# Patient Record
Sex: Female | Born: 1995 | Race: Black or African American | Hispanic: No | Marital: Single | Smoking: Never smoker
Health system: Southern US, Community
[De-identification: ages and names within clinical notes are randomized; demographics above are authoritative.]

## PROBLEM LIST (undated history)

## (undated) HISTORY — PX: SKIN GRAFT: SHX250

## (undated) HISTORY — PX: KNEE ARTHROSCOPY: SUR90

## (undated) HISTORY — PX: KNEE SURGERY: SHX244

---
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
## 2017-07-26 ENCOUNTER — Ambulatory Visit (HOSPITAL_COMMUNITY)
Admission: EM | Admit: 2017-07-26 | Discharge: 2017-07-26 | Disposition: A | Discharge status: Home / Self Care | Payer: BLUE CROSS/BLUE SHIELD | Attending: Physician Assistant | Admitting: Physician Assistant

## 2017-07-26 ENCOUNTER — Encounter (HOSPITAL_COMMUNITY): Payer: Self-pay | Admitting: Emergency Medicine

## 2017-07-26 DIAGNOSIS — H0015 Chalazion left lower eyelid: Secondary | ICD-10-CM

## 2017-07-26 MED ORDER — DOXYCYCLINE HYCLATE 100 MG PO CAPS: 100.0000 mg | ORAL_CAPSULE | Freq: Two times a day (BID) | ORAL | 0 refills | Status: AC

## 2017-07-26 NOTE — Discharge Instructions (Signed)
If not improving after taking doxycyline the please Google: [facility] and tell them that Deliah BostonMichael Neal Oshea PA-C advised that you go there.

## 2017-07-26 NOTE — ED Provider Notes (Signed)
07/26/2017 8:20 PM   DOB: 1995-11-14 / MRN: [id number]  SUBJECTIVE:  Nicole Harmon is a 21 y.o. female presenting for bump in the left lower eye lid.  No changes in vision.  The lesion is mildly painful.  No fever. She has an IUD in place, which was placed in the last 6 months.   She has No Known Allergies.   She  has no past medical history on file.    She  reports that  has never smoked. she has never used smokeless tobacco. She reports that she does not drink alcohol or use drugs. She  has no sexual activity history on file. The patient  has a past surgical history that includes Knee arthroscopy and Skin graft.  Her family history is not on file.  Review of Systems  Constitutional: Negative for chills and fever.  Eyes: Negative for blurred vision, double vision, photophobia, pain, discharge and redness.  Skin: Negative for itching and rash.  Neurological: Negative for dizziness.    OBJECTIVE:  BP 115/76 (BP Location: Right Arm)   Pulse 91   Temp 98.9 F (37.2 C) (Oral)   Resp 18   SpO2 99%   Physical Exam  Constitutional: She is oriented to person, place, and time. She appears well-developed and well-nourished. No distress.  HENT:  Mouth/Throat: No oropharyngeal exudate.  Eyes: Right conjunctiva is not injected. Right conjunctiva has no hemorrhage. Left conjunctiva is not injected. Left conjunctiva has no hemorrhage. Right eye exhibits normal extraocular motion and no nystagmus. Left eye exhibits normal extraocular motion and no nystagmus. Right pupil is round and reactive. Left pupil is round and reactive. Pupils are equal.    Cardiovascular: Normal rate and regular rhythm.  Pulmonary/Chest: Effort normal and breath sounds normal.  Musculoskeletal: Normal range of motion.  Neurological: She is alert and oriented to person, place, and time.  Skin: She is not diaphoretic.    No results found for this or any previous visit (from the past 72 hour(s)).  No results  found.  ASSESSMENT AND PLAN:  The encounter diagnosis was Chalazion of left lower eyelid. Doxy in not improving with warm compress.     The patient is advised to call or return to clinic if she does not see an improvement in symptoms, or to seek the care of the closest emergency department if she worsens with the above plan.   Deliah BostonMichael Kazuto Sevey, MHS, PA-C 07/26/2017 8:20 PM    Ofilia Neaslark, Reia Viernes L, PA-C 07/26/17 2032

## 2017-07-26 NOTE — ED Triage Notes (Signed)
Pt sts raised area inside left eye lid x 2 days

## 2017-08-05 ENCOUNTER — Encounter (HOSPITAL_COMMUNITY): Payer: Self-pay | Admitting: *Deleted

## 2017-08-05 ENCOUNTER — Emergency Department (HOSPITAL_COMMUNITY)
Admission: EM | Admit: 2017-08-05 | Discharge: 2017-08-05 | Disposition: A | Discharge status: Home / Self Care | Payer: BLUE CROSS/BLUE SHIELD | Attending: Emergency Medicine | Admitting: Emergency Medicine

## 2017-08-05 DIAGNOSIS — R51 Headache: Secondary | ICD-10-CM | POA: Insufficient documentation

## 2017-08-05 DIAGNOSIS — Z5321 Procedure and treatment not carried out due to patient leaving prior to being seen by health care provider: Secondary | ICD-10-CM | POA: Diagnosis not present

## 2017-08-05 NOTE — ED Notes (Signed)
Pt states she is leaving due to wait.  Encouraged her to wait to be seen and explained that she was next to be seen in fast track area.  Pt states she was told that an hour ago and no longer wants to wait to be seen.  Pt left.
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
## 2017-08-06 ENCOUNTER — Encounter (HOSPITAL_BASED_OUTPATIENT_CLINIC_OR_DEPARTMENT_OTHER): Payer: Self-pay | Admitting: *Deleted

## 2017-08-06 ENCOUNTER — Emergency Department (HOSPITAL_BASED_OUTPATIENT_CLINIC_OR_DEPARTMENT_OTHER)
Admission: EM | Admit: 2017-08-06 | Discharge: 2017-08-06 | Disposition: A | Discharge status: Home / Self Care | Payer: BLUE CROSS/BLUE SHIELD | Attending: Emergency Medicine | Admitting: Emergency Medicine

## 2017-08-06 ENCOUNTER — Other Ambulatory Visit: Payer: Self-pay

## 2017-08-06 ENCOUNTER — Emergency Department (HOSPITAL_BASED_OUTPATIENT_CLINIC_OR_DEPARTMENT_OTHER): Payer: BLUE CROSS/BLUE SHIELD

## 2017-08-06 DIAGNOSIS — S2091XA Abrasion of unspecified parts of thorax, initial encounter: Secondary | ICD-10-CM | POA: Diagnosis not present

## 2017-08-06 DIAGNOSIS — Z23 Encounter for immunization: Secondary | ICD-10-CM | POA: Diagnosis not present

## 2017-08-06 DIAGNOSIS — R51 Headache: Secondary | ICD-10-CM | POA: Diagnosis not present

## 2017-08-06 DIAGNOSIS — S0512XA Contusion of eyeball and orbital tissues, left eye, initial encounter: Secondary | ICD-10-CM | POA: Diagnosis not present

## 2017-08-06 DIAGNOSIS — Y998 Other external cause status: Secondary | ICD-10-CM | POA: Diagnosis not present

## 2017-08-06 DIAGNOSIS — R0789 Other chest pain: Secondary | ICD-10-CM | POA: Diagnosis not present

## 2017-08-06 DIAGNOSIS — R0781 Pleurodynia: Secondary | ICD-10-CM

## 2017-08-06 DIAGNOSIS — Y92199 Unspecified place in other specified residential institution as the place of occurrence of the external cause: Secondary | ICD-10-CM | POA: Insufficient documentation

## 2017-08-06 DIAGNOSIS — Y9389 Activity, other specified: Secondary | ICD-10-CM | POA: Insufficient documentation

## 2017-08-06 DIAGNOSIS — T1490XA Injury, unspecified, initial encounter: Secondary | ICD-10-CM

## 2017-08-06 DIAGNOSIS — S0592XA Unspecified injury of left eye and orbit, initial encounter: Secondary | ICD-10-CM | POA: Diagnosis present

## 2017-08-06 MED ORDER — METHOCARBAMOL 500 MG PO TABS: 500.0000 mg | ORAL_TABLET | Freq: Two times a day (BID) | ORAL | 0 refills | Status: AC

## 2017-08-06 MED ORDER — IBUPROFEN 400 MG PO TABS
600.0000 mg | ORAL_TABLET | Freq: Once | ORAL | Status: AC
  Administered 2017-08-06: 600 mg via ORAL
  Filled 2017-08-06: qty 1

## 2017-08-06 MED ORDER — FLUCONAZOLE 150 MG PO TABS
150.0000 mg | ORAL_TABLET | ORAL | 0 refills | Status: DC
Start: 2017-08-08 — End: 2019-10-30

## 2017-08-06 MED ORDER — TETANUS-DIPHTH-ACELL PERTUSSIS 5-2.5-18.5 LF-MCG/0.5 IM SUSP
0.5000 mL | Freq: Once | INTRAMUSCULAR | Status: AC
  Administered 2017-08-06: 0.5 mL via INTRAMUSCULAR
  Filled 2017-08-06: qty 0.5
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
## 2017-08-06 NOTE — ED Notes (Signed)
ED Provider at bedside. 

## 2017-08-06 NOTE — ED Notes (Signed)
Pt in X-Ray ?

## 2017-12-15 ENCOUNTER — Other Ambulatory Visit: Payer: Self-pay | Admitting: Physician Assistant

## 2019-10-30 ENCOUNTER — Encounter (HOSPITAL_COMMUNITY): Payer: Self-pay | Admitting: Emergency Medicine

## 2019-10-30 ENCOUNTER — Other Ambulatory Visit: Payer: Self-pay

## 2019-10-30 ENCOUNTER — Emergency Department (HOSPITAL_COMMUNITY)
Admission: EM | Admit: 2019-10-30 | Discharge: 2019-10-30 | Disposition: A | Discharge status: Home / Self Care | Payer: BC Managed Care – PPO | Attending: Emergency Medicine | Admitting: Emergency Medicine

## 2019-10-30 DIAGNOSIS — R112 Nausea with vomiting, unspecified: Secondary | ICD-10-CM | POA: Insufficient documentation

## 2019-10-30 DIAGNOSIS — F1290 Cannabis use, unspecified, uncomplicated: Secondary | ICD-10-CM | POA: Diagnosis not present

## 2019-10-30 DIAGNOSIS — R42 Dizziness and giddiness: Secondary | ICD-10-CM | POA: Insufficient documentation

## 2019-10-30 LAB — URINALYSIS, ROUTINE W REFLEX MICROSCOPIC
Bilirubin Urine: NEGATIVE
Glucose, UA: NEGATIVE mg/dL
Hgb urine dipstick: NEGATIVE
Ketones, ur: 80 mg/dL — AB
Nitrite: NEGATIVE
Protein, ur: 30 mg/dL — AB
Specific Gravity, Urine: 1.032 — ABNORMAL HIGH (ref 1.005–1.030)
pH: 6 (ref 5.0–8.0)

## 2019-10-30 LAB — COMPREHENSIVE METABOLIC PANEL
ALT: 17 U/L (ref 0–44)
AST: 23 U/L (ref 15–41)
Albumin: 4.3 g/dL (ref 3.5–5.0)
Alkaline Phosphatase: 42 U/L (ref 38–126)
Anion gap: 11 (ref 5–15)
BUN: 11 mg/dL (ref 6–20)
CO2: 23 mmol/L (ref 22–32)
Calcium: 9.6 mg/dL (ref 8.9–10.3)
Chloride: 103 mmol/L (ref 98–111)
Creatinine, Ser: 0.98 mg/dL (ref 0.44–1.00)
GFR calc Af Amer: >60 mL/min (ref >60)
GFR calc non Af Amer: >60 mL/min (ref >60)
Glucose, Bld: 116 mg/dL — ABNORMAL HIGH (ref 70–99)
Potassium: 3.3 mmol/L — ABNORMAL LOW (ref 3.5–5.1)
Sodium: 137 mmol/L (ref 135–145)
Total Bilirubin: 2.1 mg/dL — ABNORMAL HIGH (ref 0.3–1.2)
Total Protein: 7.7 g/dL (ref 6.5–8.1)

## 2019-10-30 LAB — CBC
HCT: 39.9 % (ref 36.0–46.0)
Hemoglobin: 12.6 g/dL (ref 12.0–15.0)
MCH: 27.7 pg (ref 26.0–34.0)
MCHC: 31.6 g/dL (ref 30.0–36.0)
MCV: 87.7 fL (ref 80.0–100.0)
Platelets: 249 10*3/uL (ref 150–400)
RBC: 4.55 MIL/uL (ref 3.87–5.11)
RDW: 13.4 % (ref 11.5–15.5)
WBC: 10.9 10*3/uL — ABNORMAL HIGH (ref 4.0–10.5)
nRBC: 0 % (ref 0.0–0.2)

## 2019-10-30 LAB — I-STAT BETA HCG BLOOD, ED (MC, WL, AP ONLY): I-stat hCG, quantitative: <5 m[IU]/mL (ref <5)

## 2019-10-30 LAB — LIPASE, BLOOD: Lipase: 26 U/L (ref 11–51)

## 2019-10-30 MED ORDER — ONDANSETRON HCL 4 MG/2ML IJ SOLN
4.0000 mg | Freq: Once | INTRAMUSCULAR | Status: AC
  Administered 2019-10-30: 22:00:00 4 mg via INTRAVENOUS
  Filled 2019-10-30: qty 2

## 2019-10-30 MED ORDER — SODIUM CHLORIDE 0.9% FLUSH
3.0000 mL | Freq: Once | INTRAVENOUS | Status: AC
  Administered 2019-10-30: 18:00:00 3 mL via INTRAVENOUS

## 2019-10-30 MED ORDER — HALOPERIDOL LACTATE 5 MG/ML IJ SOLN
1.0000 mg | Freq: Once | INTRAMUSCULAR | Status: AC
  Administered 2019-10-30: 23:00:00 1 mg via INTRAVENOUS
  Filled 2019-10-30: qty 1

## 2019-10-30 MED ORDER — ONDANSETRON 4 MG PO TBDP: 4.0000 mg | ORAL_TABLET | Freq: Three times a day (TID) | ORAL | 0 refills | Status: AC | PRN

## 2019-10-30 MED ORDER — POTASSIUM CHLORIDE CRYS ER 20 MEQ PO TBCR
40.0000 meq | EXTENDED_RELEASE_TABLET | Freq: Once | ORAL | Status: AC
  Administered 2019-10-30: 40 meq via ORAL
  Filled 2019-10-30: qty 2

## 2019-10-30 MED ORDER — HALOPERIDOL LACTATE 5 MG/ML IJ SOLN
2.0000 mg | Freq: Once | INTRAMUSCULAR | Status: AC
  Administered 2019-10-30: 2 mg via INTRAVENOUS
  Filled 2019-10-30: qty 1

## 2019-10-30 MED ORDER — ONDANSETRON HCL 4 MG/2ML IJ SOLN
4.0000 mg | Freq: Once | INTRAMUSCULAR | Status: AC
  Administered 2019-10-30: 4 mg via INTRAVENOUS
  Filled 2019-10-30: qty 2

## 2019-10-30 MED ORDER — SODIUM CHLORIDE 0.9 % IV BOLUS
1000.0000 mL | Freq: Once | INTRAVENOUS | Status: AC
  Administered 2019-10-30: 1000 mL via INTRAVENOUS

## 2019-10-30 MED ORDER — ALUM & MAG HYDROXIDE-SIMETH 200-200-20 MG/5ML PO SUSP
30.0000 mL | Freq: Once | ORAL | Status: AC
Start: 2019-10-30 — End: 2019-10-30
  Administered 2019-10-30: 18:00:00 30 mL via ORAL
  Filled 2019-10-30: qty 30

## 2019-10-30 MED ORDER — SODIUM CHLORIDE 0.9 % IV BOLUS
1000.0000 mL | Freq: Once | INTRAVENOUS | Status: AC
  Administered 2019-10-30: 18:00:00 1000 mL via INTRAVENOUS

## 2019-10-30 MED ORDER — LIDOCAINE VISCOUS HCL 2 % MT SOLN
15.0000 mL | Freq: Once | OROMUCOSAL | Status: AC
  Administered 2019-10-30: 15 mL via ORAL
  Filled 2019-10-30: qty 15

## 2019-10-30 NOTE — Discharge Instructions (Signed)
Take zofran as directed.   Make sure you are staying hydrated and getting plenty of fluids.   Return for any vomiting, fevers, abdominal pain or any other worsening concerning symptoms.

## 2019-10-30 NOTE — ED Notes (Signed)
Pt discharge, follow up, and prescription education provided. Pt verbalizes understanding. Pt is alert and oriented x 4 at discharge.
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
## 2019-10-30 NOTE — ED Provider Notes (Signed)
[facility] EMERGENCY DEPARTMENT Provider Note   CSN: [id number] Arrival date & time: 10/30/19  1623     History Chief Complaint  Patient presents with  . Emesis  . Nausea    Nicole Harmon is a 24 y.o. female who presents for evaluation of nausea/vomiting x2 days.  She reports that before onset of symptoms, she traveled to [place] and does report alcohol use.  She also endorses smoking marijuana prior to onset of symptoms.  She states that she has had issues with her stomach before and states she followed with a GI doctor in [place].  At that time, she was diagnosed with gastritis.  They did an endoscopy but patient states that they did not find any abnormalities.  She states that they attributed her symptoms to drinking and marijuana use.  She did have a trial where she did not do any alcohol or marijuana and states that she did feel better.  She does report that she constantly smokes marijuana and does report that she was smoking in a week up to her symptoms.  She states she has felt generalized weakness secondary to the nausea/vomiting.  She states that emesis is nonbloody, nonbilious.  No diarrhea.  No associated abdominal pain.  She states she feels soreness in her abdomen after vomiting.  She also endorses feeling lightheaded.  She went to urgent care today for evaluation of her symptoms.  They gave her a liter fluid but she was still having nausea and they encouraged her to go to the emergency department for further evaluation.  She denies any fevers, chest pain, dysuria, hematuria.  She does feel like she has some slight shortness of breath with vomiting.  Otherwise denies any other cough, congestion.  She had a negative Covid test at urgent care.  The history is provided by the patient.       History reviewed. No pertinent past medical history.  There are no problems to display for this patient.   Past Surgical History:  Procedure Laterality Date  .  KNEE ARTHROSCOPY    . KNEE SURGERY Right    x 3  . SKIN GRAFT       OB History   No obstetric history on file.     History reviewed. No pertinent family history.  Social History   Tobacco Use  . Smoking status: Never Smoker  . Smokeless tobacco: Never Used  Substance Use Topics  . Alcohol use: No  . Drug use: No    Home Medications Prior to Admission medications   Medication Sig Start Date End Date Taking? Authorizing Provider  levonorgestrel (MIRENA) 20 MCG/24HR IUD 1 each by Intrauterine route once.    Yes [provider]  methocarbamol (ROBAXIN) 500 MG tablet Take 1 tablet (500 mg total) by mouth 2 (two) times daily. Patient not taking: Reported on 10/30/2019 08/06/17   Langston Masker B, PA-C  ondansetron (ZOFRAN ODT) 4 MG disintegrating tablet Take 1 tablet (4 mg total) by mouth every 8 (eight) hours as needed for nausea or vomiting. 10/30/19   Volanda Napoleon, PA-C    Allergies    Patient has no known allergies.  Review of Systems   Review of Systems  Constitutional: Negative for fever.  Respiratory: Negative for cough and shortness of breath.   Cardiovascular: Negative for chest pain.  Gastrointestinal: Positive for nausea and vomiting. Negative for abdominal pain.  Genitourinary: Negative for dysuria and hematuria.  Neurological: Positive for light-headedness. Negative for weakness,  numbness and headaches.  All other systems reviewed and are negative.   Physical Exam Updated Vital Signs BP (!) 123/95   Pulse (!) 57   Temp 98.6 F (37 C) (Oral)   Resp 17   Ht _0  (1.626 m)   Wt 72.1 kg   SpO2 98%   BMI 27.28 kg/m   Physical Exam Vitals and nursing note reviewed.  Constitutional:      Appearance: Normal appearance. She is well-developed.  HENT:     Head: Normocephalic and atraumatic.     Mouth/Throat:     Comments: Dry mucous membranes. Eyes:     General: Lids are normal.     Conjunctiva/sclera: Conjunctivae normal.     Pupils:  Pupils are equal, round, and reactive to light.  Cardiovascular:     Rate and Rhythm: Normal rate and regular rhythm.     Pulses: Normal pulses.     Heart sounds: Normal heart sounds. No murmur. No friction rub. No gallop.   Pulmonary:     Effort: Pulmonary effort is normal.     Breath sounds: Normal breath sounds.     Comments: Lungs clear to auscultation bilaterally.  Symmetric chest rise.  No wheezing, rales, rhonchi. Abdominal:     Palpations: Abdomen is soft. Abdomen is not rigid.     Tenderness: There is no abdominal tenderness. There is no guarding.     Comments: Abdomen is soft, non-distended, non-tender. No rigidity, No guarding. No peritoneal signs.  Musculoskeletal:        General: Normal range of motion.     Cervical back: Full passive range of motion without pain.  Skin:    General: Skin is warm and dry.     Capillary Refill: Capillary refill takes less than 2 seconds.  Neurological:     Mental Status: She is alert and oriented to person, place, and time.  Psychiatric:        Speech: Speech normal.     ED Results / Procedures / Treatments   Labs (all labs ordered are listed, but only abnormal results are displayed) Labs Reviewed  COMPREHENSIVE METABOLIC PANEL - Abnormal; Notable for the following components:      Result Value   Potassium 3.3 (*)    Glucose, Bld 116 (*)    Total Bilirubin 2.1 (*)    All other components within normal limits  CBC - Abnormal; Notable for the following components:   WBC 10.9 (*)    All other components within normal limits  URINALYSIS, ROUTINE W REFLEX MICROSCOPIC - Abnormal; Notable for the following components:   APPearance HAZY (*)    Specific Gravity, Urine 1.032 (*)    Ketones, ur 80 (*)    Protein, ur 30 (*)    Leukocytes,Ua SMALL (*)    Bacteria, UA RARE (*)    All other components within normal limits  URINE CULTURE  LIPASE, BLOOD  I-STAT BETA HCG BLOOD, ED ([facility], [facility], [facility] ONLY)    EKG EKG  Interpretation  Date/Time:  Tuesday October 30 2019 17:31:53 EST Ventricular Rate:  51 PR Interval:    QRS Duration: 90 QT Interval:  438 QTC Calculation: 404 R Axis:   66 Text Interpretation: Sinus rhythm Atrial premature complexes Confirmed by Dory Horn) on 10/31/2019 7:44:56 AM   Radiology DG Chest 2 View  Result Date: 10/31/2019 CLINICAL DATA:  Onset nausea and vomiting this past weekend. EXAM: CHEST - 2 VIEW COMPARISON:  Single-view of the chest 08/06/2017. FINDINGS: Lungs  clear. Heart size normal. No pneumothorax or pleural fluid. No bony abnormality. IMPRESSION: Negative chest. Electronically Signed   By: Inge Rise M.D.   On: 10/31/2019 13:13   US Abdomen Limited RUQ  Result Date: 10/31/2019 CLINICAL DATA:  Nausea and vomiting for 5 days. EXAM: ULTRASOUND ABDOMEN LIMITED RIGHT UPPER QUADRANT COMPARISON:  None. FINDINGS: Gallbladder: No gallstones or wall thickening visualized. No sonographic Murphy sign noted by sonographer. Almost completely decompressed. Common bile duct: Diameter: 0.2 cm. Liver: No focal lesion identified. Within normal limits in parenchymal echogenicity. Portal vein is patent on color Doppler imaging with normal direction of blood flow towards the liver. Other: None. IMPRESSION: Negative for gallstones.  Negative exam. Electronically Signed   By: Inge Rise M.D.   On: 10/31/2019 13:41    Procedures Procedures (including critical care time)  Medications Ordered in ED Medications  sodium chloride flush (NS) 0.9 % injection 3 mL (3 mLs Intravenous Given 10/30/19 1811)  ondansetron (ZOFRAN) injection 4 mg (4 mg Intravenous Given 10/30/19 1810)  sodium chloride 0.9 % bolus 1,000 mL (0 mLs Intravenous Stopped 10/30/19 1914)  alum & mag hydroxide-simeth (MAALOX/MYLANTA) 200-200-20 MG/5ML suspension 30 mL (30 mLs Oral Given 10/30/19 1810)    And  lidocaine (XYLOCAINE) 2 % viscous mouth solution 15 mL (15 mLs Oral Given 10/30/19 1810)  potassium  chloride SA (KLOR-CON) CR tablet 40 mEq (40 mEq Oral Given 10/30/19 1818)  haloperidol lactate (HALDOL) injection 2 mg (2 mg Intravenous Given 10/30/19 1949)  sodium chloride 0.9 % bolus 1,000 mL (0 mLs Intravenous Stopped 10/30/19 2221)  ondansetron (ZOFRAN) injection 4 mg (4 mg Intravenous Given 10/30/19 2221)  haloperidol lactate (HALDOL) injection 1 mg (1 mg Intravenous Given 10/30/19 2237)    ED Course  I have reviewed the triage vital signs and the nursing notes.  Pertinent labs & imaging results that were available during my care of the patient were reviewed by me and considered in my medical decision making (see chart for details).    MDM Rules/Calculators/A&P                      24 year old female who presents for evaluation of nausea/vomiting x2 days.  Does endorse marijuana and alcohol use prior to onset of symptoms.  No fevers, abdominal pain.  Seen at urgent care and sent over for further evaluation.  On initial arrival, she is afebrile, nontoxic-appearing.  Vital signs are stable.  Abdomen exam is benign.  She does have some dry mucous membranes.  Question of this is dehydration versus infectious etiology.  Also consider hyperemesis cannabinoid syndrome given recent marijuana use.  Plan to check labs, give fluids and give antiemetics.  I-STAT beta negative.  CMP shows normal pain creatinine.  Slight hypokalemia.  Patient potassium replacement.  Total bili is elevated at 2.1.  AST, ALT, alk phos unremarkable.  Lipase is unremarkable.  CBC shows slight leukocytosis of 10.9.  UA shows leukocytes, small amount of pyuria, bacteria but there is squamous epithelium so question contaminant.  She is not having any dysuria or hematuria.  Do not suspect this to be [place].  Reevaluation.  Patient still having vomiting after Zofran.  Will attempt Haldol.  Reevaluation of the Haldol.  Patient is resting comfortably with no signs of distress.  Reevaluation.  Patient reports improvement.  She has  been resting comfortably and has not had any more nausea/vomiting.  She has been able to tolerate p.o. here in emergency department.  We will plan  to discharge patient home.  At this time, no indication for CT on pelvis as do not suspect surgical abdomen.  We will plan to give her follow-up with GI.   RN inform me that patient was still nauseous.  We will plan to give her additional dose of Haldol and monitor.  At this time, patient exhibits no emergent life-threatening condition that require further evaluation in ED or admission. Patient had ample opportunity for questions and discussion. All patient's questions were answered with full understanding. Strict return precautions discussed. Patient expresses understanding and agreement to plan.   Portions of this note were generated with L[employer]. Dictation errors may occur despite best attempts at proofreading.  Final Clinical Impression(s) / ED Diagnoses Final diagnoses:  Non-intractable vomiting with nausea, unspecified vomiting type    Rx / DC Orders ED Discharge Orders         Ordered    ondansetron (ZOFRAN ODT) 4 MG disintegrating tablet  Every 8 hours PRN     10/30/19 2225           Desma Mcgregor 10/31/19 2042    Margette Fast, MD 11/03/19 1135

## 2019-10-31 ENCOUNTER — Encounter (HOSPITAL_COMMUNITY): Payer: Self-pay

## 2019-10-31 ENCOUNTER — Other Ambulatory Visit: Payer: Self-pay

## 2019-10-31 ENCOUNTER — Emergency Department (HOSPITAL_COMMUNITY): Payer: BC Managed Care – PPO

## 2019-10-31 ENCOUNTER — Emergency Department (HOSPITAL_COMMUNITY)
Admission: EM | Admit: 2019-10-31 | Discharge: 2019-10-31 | Disposition: A | Discharge status: Home / Self Care | Payer: BC Managed Care – PPO | Attending: Emergency Medicine | Admitting: Emergency Medicine

## 2019-10-31 DIAGNOSIS — Z975 Presence of (intrauterine) contraceptive device: Secondary | ICD-10-CM | POA: Insufficient documentation

## 2019-10-31 DIAGNOSIS — R109 Unspecified abdominal pain: Secondary | ICD-10-CM | POA: Diagnosis not present

## 2019-10-31 DIAGNOSIS — R112 Nausea with vomiting, unspecified: Secondary | ICD-10-CM | POA: Diagnosis present

## 2019-10-31 LAB — CBC WITH DIFFERENTIAL/PLATELET
Abs Immature Granulocytes: 0.03 10*3/uL (ref 0.00–0.07)
Basophils Absolute: 0 10*3/uL (ref 0.0–0.1)
Basophils Relative: 0 %
Eosinophils Absolute: 0 10*3/uL (ref 0.0–0.5)
Eosinophils Relative: 0 %
HCT: 41.8 % (ref 36.0–46.0)
Hemoglobin: 13.1 g/dL (ref 12.0–15.0)
Immature Granulocytes: 0 %
Lymphocytes Relative: 14 %
Lymphs Abs: 1.2 10*3/uL (ref 0.7–4.0)
MCH: 27.5 pg (ref 26.0–34.0)
MCHC: 31.3 g/dL (ref 30.0–36.0)
MCV: 87.8 fL (ref 80.0–100.0)
Monocytes Absolute: 0.6 10*3/uL (ref 0.1–1.0)
Monocytes Relative: 7 %
Neutro Abs: 7.2 10*3/uL (ref 1.7–7.7)
Neutrophils Relative %: 79 %
Platelets: 236 10*3/uL (ref 150–400)
RBC: 4.76 MIL/uL (ref 3.87–5.11)
RDW: 13.3 % (ref 11.5–15.5)
WBC: 9.1 10*3/uL (ref 4.0–10.5)
nRBC: 0 % (ref 0.0–0.2)

## 2019-10-31 LAB — COMPREHENSIVE METABOLIC PANEL
ALT: 23 U/L (ref 0–44)
AST: 28 U/L (ref 15–41)
Albumin: 4.4 g/dL (ref 3.5–5.0)
Alkaline Phosphatase: 44 U/L (ref 38–126)
Anion gap: 11 (ref 5–15)
BUN: 9 mg/dL (ref 6–20)
CO2: 26 mmol/L (ref 22–32)
Calcium: 9.4 mg/dL (ref 8.9–10.3)
Chloride: 102 mmol/L (ref 98–111)
Creatinine, Ser: 0.92 mg/dL (ref 0.44–1.00)
GFR calc Af Amer: >60 mL/min (ref >60)
GFR calc non Af Amer: >60 mL/min (ref >60)
Glucose, Bld: 112 mg/dL — ABNORMAL HIGH (ref 70–99)
Potassium: 3.4 mmol/L — ABNORMAL LOW (ref 3.5–5.1)
Sodium: 139 mmol/L (ref 135–145)
Total Bilirubin: 2.6 mg/dL — ABNORMAL HIGH (ref 0.3–1.2)
Total Protein: 7.5 g/dL (ref 6.5–8.1)

## 2019-10-31 LAB — I-STAT BETA HCG BLOOD, ED (MC, WL, AP ONLY): I-stat hCG, quantitative: <5 m[IU]/mL (ref <5)

## 2019-10-31 LAB — LIPASE, BLOOD: Lipase: 30 U/L (ref 11–51)

## 2019-10-31 MED ORDER — HALOPERIDOL LACTATE 5 MG/ML IJ SOLN
2.0000 mg | Freq: Once | INTRAMUSCULAR | Status: AC
  Administered 2019-10-31: 2 mg via INTRAVENOUS
  Filled 2019-10-31: qty 1

## 2019-10-31 MED ORDER — SODIUM CHLORIDE 0.9 % IV BOLUS
1000.0000 mL | Freq: Once | INTRAVENOUS | Status: AC
  Administered 2019-10-31: 1000 mL via INTRAVENOUS

## 2019-10-31 NOTE — ED Notes (Signed)
Patient verbalizes understanding of discharge instructions. Opportunity for questioning and answers were provided. Armband removed by staff, pt discharged from ED.  

## 2019-10-31 NOTE — ED Triage Notes (Signed)
Pt reports continued n/v since this weekend, pt seen yesterday and given prescriptions but has not had them filled yet. States she woke up feeling bad so she came here.

## 2019-10-31 NOTE — ED Provider Notes (Signed)
[facility] EMERGENCY DEPARTMENT Provider Note   CSN: [id number] Arrival date & time: 10/31/19  1103     History Chief Complaint  Patient presents with  . Nausea  . Emesis    Nicole Harmon is a 24 y.o. female presenting for evaluation nausea vomiting.  Patient states that the past 2 to 3 days she has had persistent nausea and vomiting.  She seen in the ED yesterday, given medications which improved her symptoms.  However this morning she woke up feeling poorly again, vomited 2-3 times.  She denies blood in her emesis.  Patient reports epigastric discomfort.  She states she feels short of breath.  When asked to clarify, she states that when she takes deep breath in, she has discomfort in her upper abdomen and this is why she is short of breath.  She reports feeling very cold.  She denies known fever, chest pain, cough, urinary symptoms.  Patient states has not had a bowel movement in the past 3 days, this is abnormal for her.  She was able to tolerate some water and yogurt this morning without vomiting.  She was given prescriptions last night, but has not picked them up yet, as such has not taken anything for her symptoms.  She has not followed up with GI or made an appointment.  She drinks alcohol occasionally, none in the past 2 to 3 days.  She reports marijuana use, none the past 2 to 3 days.  She denies other drug use.  She states she takes no medications daily.  Additional history obtained from chart review.  Reviewed visit from yesterday.  Patient responded well to Haldol.  She had an elevated bili at 2, but no change in LFTs or white count.  She was not having any abdominal pain at that time, thus no abdominal imaging was done.  HPI     History reviewed. No pertinent past medical history.  There are no problems to display for this patient.   Past Surgical History:  Procedure Laterality Date  . KNEE ARTHROSCOPY    . KNEE SURGERY Right    x 3  . SKIN  GRAFT       OB History   No obstetric history on file.     No family history on file.  Social History   Tobacco Use  . Smoking status: Never Smoker  . Smokeless tobacco: Never Used  Substance Use Topics  . Alcohol use: No  . Drug use: No    Home Medications Prior to Admission medications   Medication Sig Start Date End Date Taking? Authorizing Provider  levonorgestrel (MIRENA) 20 MCG/24HR IUD 1 each by Intrauterine route once.     [provider]  methocarbamol (ROBAXIN) 500 MG tablet Take 1 tablet (500 mg total) by mouth 2 (two) times daily. Patient not taking: Reported on 10/30/2019 08/06/17   Aviva Kluver B, PA-C  ondansetron (ZOFRAN ODT) 4 MG disintegrating tablet Take 1 tablet (4 mg total) by mouth every 8 (eight) hours as needed for nausea or vomiting. 10/30/19   Maxwell Caul, PA-C    Allergies    Patient has no known allergies.  Review of Systems   Review of Systems  Gastrointestinal: Positive for abdominal pain, constipation, nausea and vomiting.  All other systems reviewed and are negative.   Physical Exam Updated Vital Signs BP 112/72 (BP Location: Right Arm)   Pulse 68   Temp 98.6 F (37 C) (Oral)   Resp 20  SpO2 96%   Physical Exam Vitals and nursing note reviewed.  Constitutional:      General: She is not in acute distress.    Appearance: She is well-developed.     Comments: Appears nontoxic  HENT:     Head: Normocephalic and atraumatic.  Eyes:     Extraocular Movements: Extraocular movements intact.     Conjunctiva/sclera: Conjunctivae normal.     Pupils: Pupils are equal, round, and reactive to light.  Cardiovascular:     Rate and Rhythm: Normal rate and regular rhythm.     Pulses: Normal pulses.  Pulmonary:     Effort: Pulmonary effort is normal. No respiratory distress.     Breath sounds: Normal breath sounds. No wheezing.     Comments: speaking in full sentences. Clear lung sounds Abdominal:     General: There is no  distension.     Palpations: Abdomen is soft. There is no mass.     Tenderness: There is abdominal tenderness. There is no guarding or rebound.     Comments: Mild discomfort with palpation of epig abd. Negative murphys. No rigidity, guarding, distention. Negative rebound. No peritonitis.   Musculoskeletal:        General: Normal range of motion.     Cervical back: Normal range of motion and neck supple.  Skin:    General: Skin is warm and dry.     Capillary Refill: Capillary refill takes less than 2 seconds.  Neurological:     Mental Status: She is alert and oriented to person, place, and time.     ED Results / Procedures / Treatments   Labs (all labs ordered are listed, but only abnormal results are displayed) Labs Reviewed  CBC WITH DIFFERENTIAL/PLATELET  COMPREHENSIVE METABOLIC PANEL  LIPASE, BLOOD  I-STAT BETA HCG BLOOD, ED ([facility], [facility], [facility] ONLY)    EKG None  Radiology No results found.  Procedures Procedures (including critical care time)  Medications Ordered in ED Medications  haloperidol lactate (HALDOL) injection 2 mg (2 mg Intravenous Given 10/31/19 1231)  sodium chloride 0.9 % bolus 1,000 mL (1,000 mLs Intravenous New Bag/Given 10/31/19 1230)    ED Course  I have reviewed the triage vital signs and the nursing notes.  Pertinent labs & imaging results that were available during my care of the patient were reviewed by me and considered in my medical decision making (see chart for details).    MDM Rules/Calculators/A&P                      Patient presenting for evaluation of of nausea vomiting.  Physical exam shows patient who appears nontoxic.  She does have some mild discomfort with palpation of the epigastric area.  Also reporting worsening abdominal pain when she takes a prescription.  As such, consider cholelithiasis, although patient without murhpys sign at this time. Pt seen yesterday for the same, did well with haldol. Will tx with this again.   Labs  interpreted by me, overall reassuring.  Bili mildly elevated at 2.6.  Once again, no change in LFTs.  No leukocytosis.  Obtain right upper quadrant ultrasound due to continued symptoms.  Chest x-ray due to shortness of breath to rule out lower lobe pneumonia.  Chest x-ray viewed interpreted by me, no pneumonia, proximal effusion, cardiomegaly.  Right upper quadrant ultrasound negative for gallstones or cholecystitis.  On reassessment, patient reports symptoms are improved.  No further vomiting.  Tolerating p.o.  I discussed importance of continued symptomatic  treatment at home and follow-up with GI.  At this time, patient appears safe for discharge.  Return precautions given.  Patient states she understands and agrees to plan.  Final Clinical Impression(s) / ED Diagnoses Final diagnoses:  Nausea & vomiting    Rx / DC Orders ED Discharge Orders    None       Alveria Apley, PA-C 10/31/19 1429    Benjiman Core, MD 10/31/19 1500

## 2019-10-31 NOTE — Discharge Instructions (Signed)
Make sure you stay well-hydrated with water. Use Zofran as needed for nausea or vomiting. Use Tylenol to help with abdominal pain.  Do not take ibuprofen, this may make your symptoms worse. Call the office listed below to set up an appointment with a stomach doctor for further evaluation of your symptoms. Return to the emergency room if you develop high fevers, persistent vomiting, severe worsening pain, or any new, worsening, or concerning symptoms.

## 2019-11-01 LAB — URINE CULTURE: Special Requests: NORMAL

## 2020-03-21 ENCOUNTER — Other Ambulatory Visit: Payer: Self-pay

## 2020-03-21 ENCOUNTER — Emergency Department (HOSPITAL_COMMUNITY)
Admission: EM | Admit: 2020-03-21 | Discharge: 2020-03-21 | Discharge status: Absent without leave | Payer: BC Managed Care – PPO

## 2020-03-21 ENCOUNTER — Encounter (HOSPITAL_BASED_OUTPATIENT_CLINIC_OR_DEPARTMENT_OTHER): Payer: Self-pay | Admitting: *Deleted

## 2020-03-21 ENCOUNTER — Emergency Department (HOSPITAL_BASED_OUTPATIENT_CLINIC_OR_DEPARTMENT_OTHER)
Admission: EM | Admit: 2020-03-21 | Discharge: 2020-03-21 | Disposition: A | Discharge status: Home / Self Care | Payer: BC Managed Care – PPO | Attending: Emergency Medicine | Admitting: Emergency Medicine

## 2020-03-21 DIAGNOSIS — Y92009 Unspecified place in unspecified non-institutional (private) residence as the place of occurrence of the external cause: Secondary | ICD-10-CM | POA: Diagnosis not present

## 2020-03-21 DIAGNOSIS — Y9389 Activity, other specified: Secondary | ICD-10-CM | POA: Insufficient documentation

## 2020-03-21 DIAGNOSIS — S61101A Unspecified open wound of right thumb with damage to nail, initial encounter: Secondary | ICD-10-CM | POA: Diagnosis present

## 2020-03-21 DIAGNOSIS — Y999 Unspecified external cause status: Secondary | ICD-10-CM | POA: Diagnosis not present

## 2020-03-21 DIAGNOSIS — S6991XA Unspecified injury of right wrist, hand and finger(s), initial encounter: Secondary | ICD-10-CM

## 2020-03-21 DIAGNOSIS — X58XXXA Exposure to other specified factors, initial encounter: Secondary | ICD-10-CM | POA: Diagnosis not present

## 2020-03-21 MED ORDER — ACETAMINOPHEN 325 MG PO TABS
ORAL_TABLET | ORAL | Status: AC
  Administered 2020-03-21: 650 mg via ORAL
  Filled 2020-03-21: qty 2

## 2020-03-21 MED ORDER — IBUPROFEN 800 MG PO TABS: 800.0000 mg | ORAL_TABLET | Freq: Once | ORAL | Status: AC

## 2020-03-21 MED ORDER — ACETAMINOPHEN ER 650 MG PO TBCR: 650.0000 mg | EXTENDED_RELEASE_TABLET | Freq: Three times a day (TID) | ORAL | 0 refills | Status: AC | PRN

## 2020-03-21 MED ORDER — IBUPROFEN 800 MG PO TABS
ORAL_TABLET | ORAL | Status: AC
  Administered 2020-03-21: 800 mg via ORAL
  Filled 2020-03-21: qty 1

## 2020-03-21 MED ORDER — ACETAMINOPHEN 325 MG PO TABS: 650.0000 mg | ORAL_TABLET | Freq: Once | ORAL | Status: AC

## 2020-03-21 NOTE — Discharge Instructions (Signed)
As discussed, you will need to go to a nail salon to have your acrylic nail removed.  Keep finger in splint and wrapped to allow for healing. Your fingernail will need to grow out to fully heal. I am sending you home with Tylenol.  Take as needed for pain control.  Please follow-up with PCP if symptoms do not improved within the next week.  Return to the ER for new or worsening symptoms.

## 2020-03-21 NOTE — ED Triage Notes (Signed)
Pt c/o right thumb nail injury

## 2020-03-21 NOTE — ED Provider Notes (Signed)
[facility] EMERGENCY DEPARTMENT Provider Note   CSN: [id number] Arrival date & time: 03/21/20  1731     History Chief Complaint  Patient presents with  . Nail Problem    Nicole Harmon is a 24 y.o. female with no significant past medical history who presents to the ED due to a right thumb nail injury.  Patient states she was putting on jeans which caught her right fingernail causing it to bend backwards.  Of note, patient has long acrylic nails. Patient believes her last tetanus was within the past 5 years.  She has been given Tylenol and ibuprofen while waiting in the waiting room.  Admits to 10/10 pain worse with movement of nail.  Denies numbness and tingling of right thumb.  History obtained from patient and past medical records. No interpreter used during encounter.      History reviewed. No pertinent past medical history.  There are no problems to display for this patient.   Past Surgical History:  Procedure Laterality Date  . KNEE ARTHROSCOPY    . KNEE SURGERY Right    x 3  . SKIN GRAFT       OB History   No obstetric history on file.     History reviewed. No pertinent family history.  Social History   Tobacco Use  . Smoking status: Never Smoker  . Smokeless tobacco: Never Used  Substance Use Topics  . Alcohol use: No  . Drug use: No    Home Medications Prior to Admission medications   Medication Sig Start Date End Date Taking? Authorizing Provider  acetaminophen (TYLENOL 8 HOUR) 650 MG CR tablet Take 1 tablet (650 mg total) by mouth every 8 (eight) hours as needed for pain. 03/21/20   Mannie Stabile, PA-C  levonorgestrel (MIRENA) 20 MCG/24HR IUD 1 each by Intrauterine route once.     [provider]  methocarbamol (ROBAXIN) 500 MG tablet Take 1 tablet (500 mg total) by mouth 2 (two) times daily. Patient not taking: Reported on 10/30/2019 08/06/17   Aviva Kluver B, PA-C  ondansetron (ZOFRAN ODT) 4 MG disintegrating tablet  Take 1 tablet (4 mg total) by mouth every 8 (eight) hours as needed for nausea or vomiting. 10/30/19   Maxwell Caul, PA-C    Allergies    Patient has no known allergies.  Review of Systems   Review of Systems  Skin: Positive for wound.  Neurological: Negative for numbness.  All other systems reviewed and are negative.   Physical Exam Updated Vital Signs BP 126/78 (BP Location: Right Arm)   Pulse 62   Temp 98.2 F (36.8 C) (Oral)   Resp 20   Ht 5\' 2"  (1.575 m)   Wt 68 kg   SpO2 99%   BMI 27.44 kg/m   Physical Exam Vitals and nursing note reviewed.  Constitutional:      General: She is not in acute distress.    Appearance: She is not ill-appearing.  HENT:     Head: Normocephalic.  Eyes:     Pupils: Pupils are equal, round, and reactive to light.  Cardiovascular:     Rate and Rhythm: Normal rate and regular rhythm.     Pulses: Normal pulses.     Heart sounds: Normal heart sounds. No murmur heard.  No friction rub. No gallop.   Pulmonary:     Effort: Pulmonary effort is normal.     Breath sounds: Normal breath sounds.  Abdominal:     General: Abdomen  is flat. There is no distension.     Palpations: Abdomen is soft.     Tenderness: There is no abdominal tenderness. There is no guarding or rebound.  Musculoskeletal:     Cervical back: Neck supple.     Comments: Full range of motion of right thumb.  No snuffbox tenderness.  Full range of motion of all right fingers.  Radial pulse intact.  Skin:    General: Skin is warm and dry.     Comments: Right fingernail avulsed from nailbed. Nail root still attached. No underlying lacerations. See photos below.   Neurological:     General: No focal deficit present.     Mental Status: She is alert.  Psychiatric:        Mood and Affect: Mood normal.        Behavior: Behavior normal.         ED Results / Procedures / Treatments   Labs (all labs ordered are listed, but only abnormal results are displayed) Labs  Reviewed - No data to display  EKG None  Radiology No results found.  Procedures Procedures (including critical care time)  Medications Ordered in ED Medications  ibuprofen (ADVIL) tablet 800 mg (800 mg Oral Given 03/21/20 2020)  acetaminophen (TYLENOL) tablet 650 mg (650 mg Oral Given 03/21/20 2020)    ED Course  I have reviewed the triage vital signs and the nursing notes.  Pertinent labs & imaging results that were available during my care of the patient were reviewed by me and considered in my medical decision making (see chart for details).    MDM Rules/Calculators/A&P                         24 year old female who presents to the ED due to right thumb nail injury.  Upon arrival, vitals all within normal limits.  Patient nontoxic-appearing. Right thumb nail separated from nailbed.  Nail root intact.  No underlying lacerations.  Hemostasis achieved.  Patient has large acrylic nails.  See photos above.  Full range of motion of right thumb.  No snuffbox tenderness.  Full range of motion of all right fingers.  Low suspicion for underlying fracture.  No x-ray warranted at this time given no direct injury to finger, fingernail just got caught on clothing.  Finger thoroughly cleaned.  Patient notes her last tetanus shot was in the past 5 years.  Acrylic nail trimmed. Finger placed in splint with dressing. Instructed patient to report to her nail salon within the next week to have her acrylic nail removed.  Patient discharged with Tylenol for pain management.  Instructed patient to follow-up with PCP if symptoms not improved within the next week. Strict ED precautions discussed with patient. Patient states understanding and agrees to plan. Patient discharged home in no acute distress and stable vitals  Final Clinical Impression(s) / ED Diagnoses Final diagnoses:  Injury of right thumbnail, initial encounter    Rx / DC Orders ED Discharge Orders         Ordered    acetaminophen (TYLENOL  8 HOUR) 650 MG CR tablet  Every 8 hours PRN     Discontinue  Reprint     03/21/20 2257           Mannie Stabile, PA-C 03/21/20 2300    Melene Plan, DO 03/21/20 2307

## 2021-09-07 IMAGING — US US ABDOMEN LIMITED
1 series · 14 of 25 positions shown · non-contrast
Comparison: None.

CLINICAL DATA: Nausea and vomiting for 5 days.

EXAM:
ULTRASOUND ABDOMEN LIMITED RIGHT UPPER QUADRANT

[Series 1: us abdomen limited · 14 of 34 slices shown]
[im 1/34]
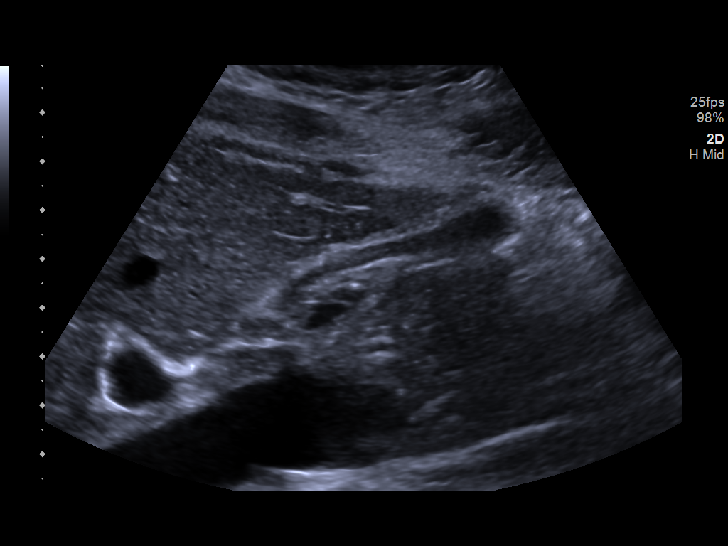
[im 3/34]
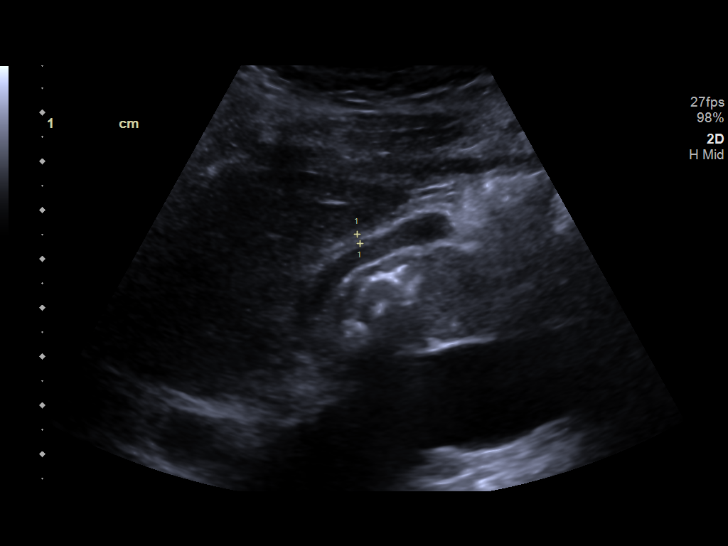
[im 6/34]
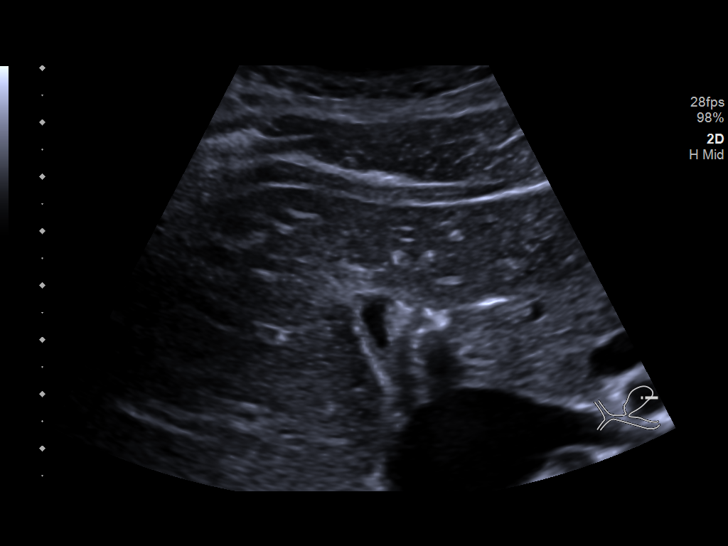
[im 9/34]
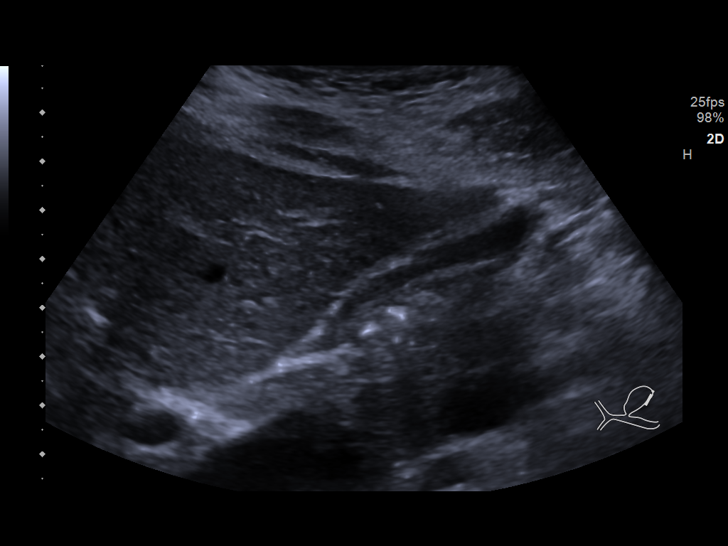
[im 12/34]
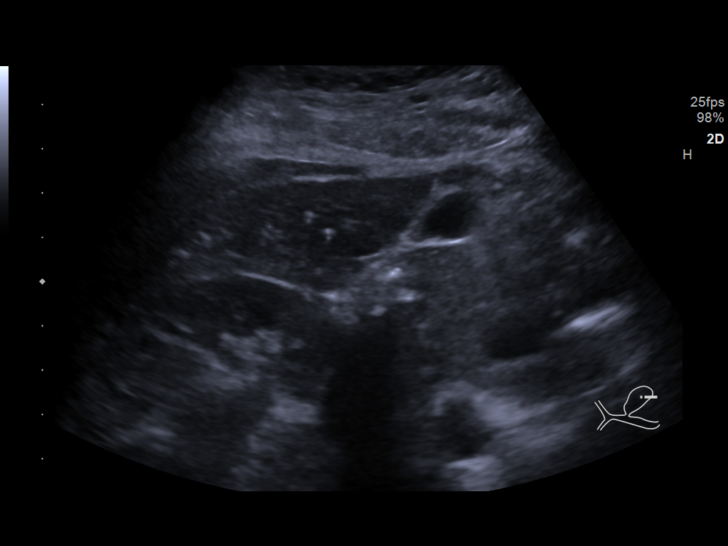
[im 13/34]
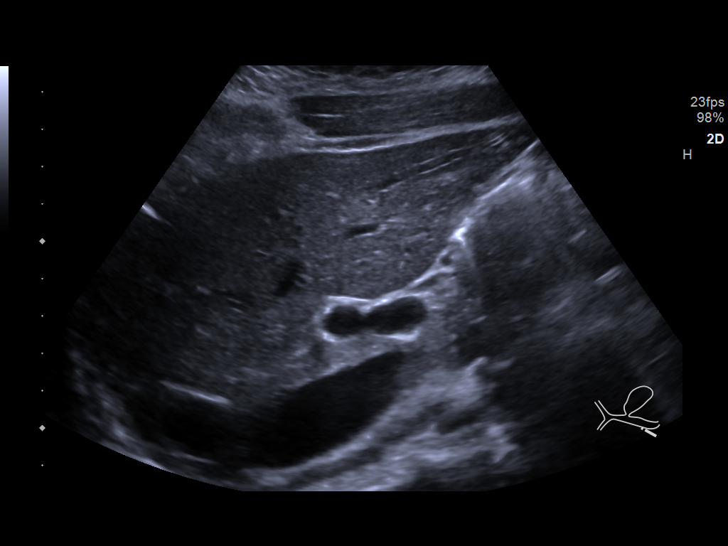
[im 16/34]
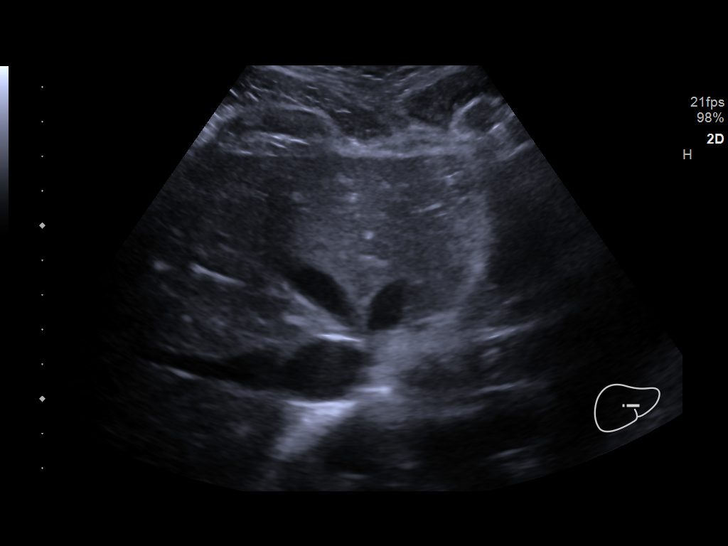
[im 18/34]
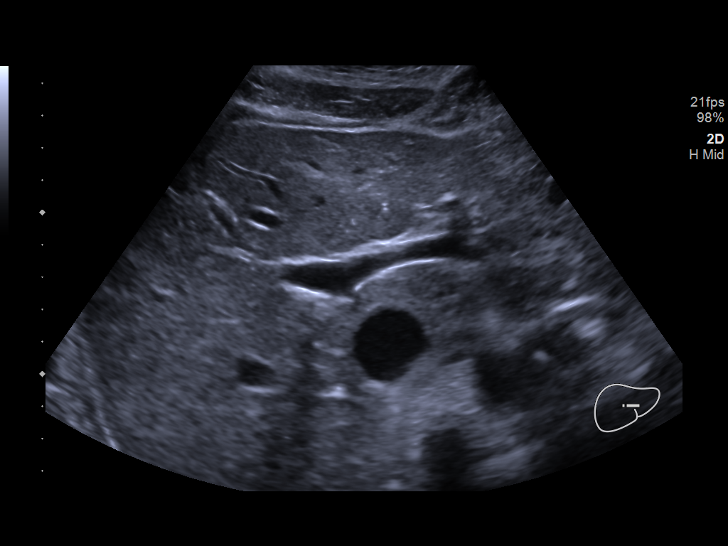
[im 21/34]
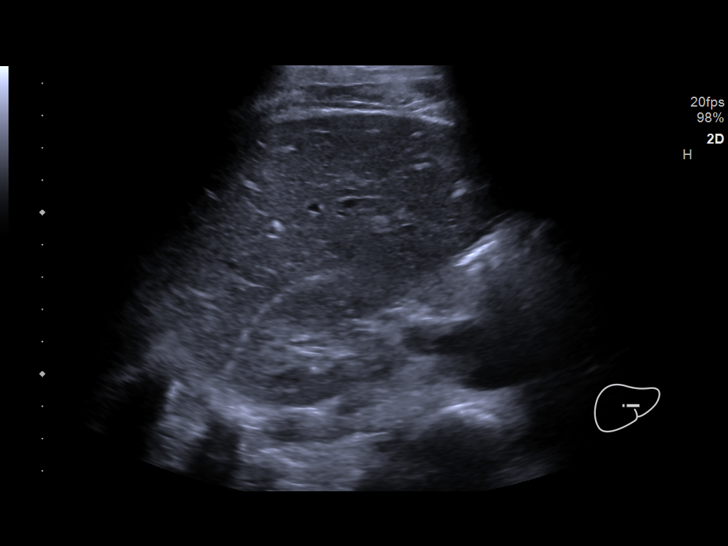
[im 23/34]
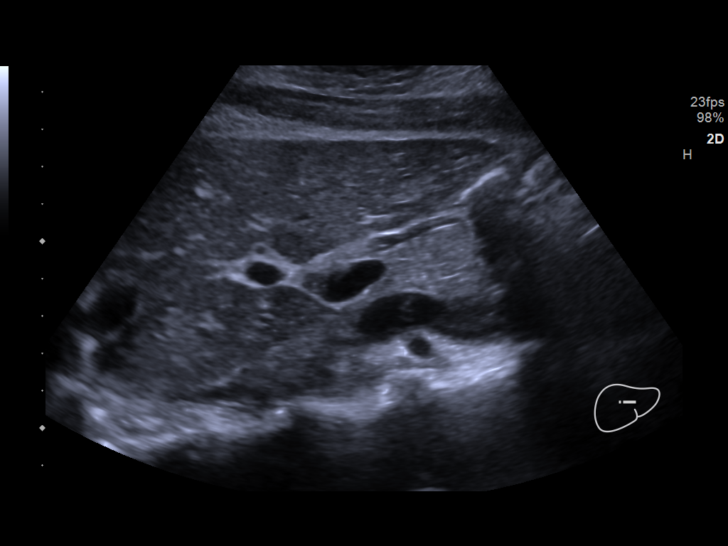
[im 25/34]
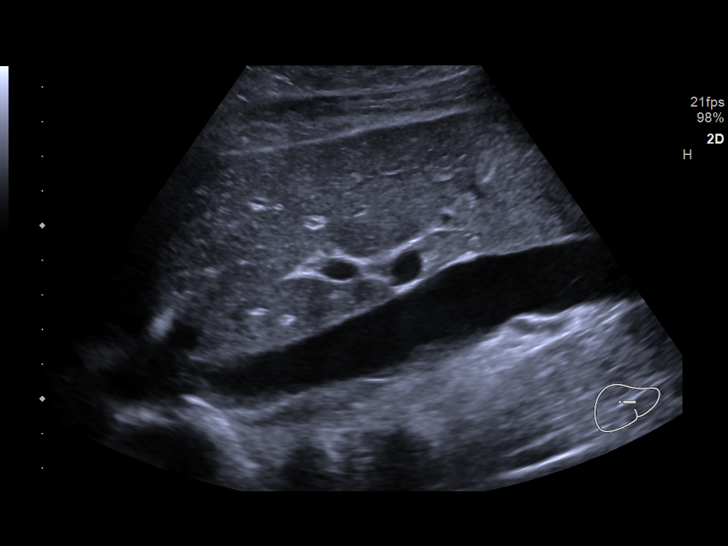
[im 28/34]
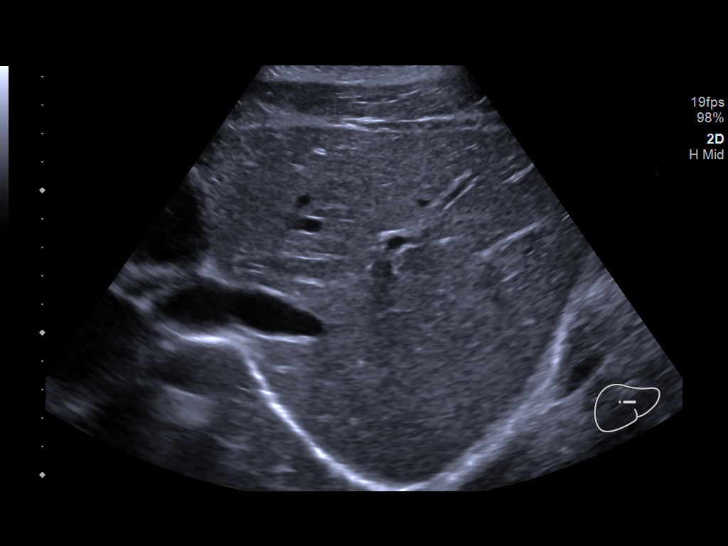
[im 31/34]
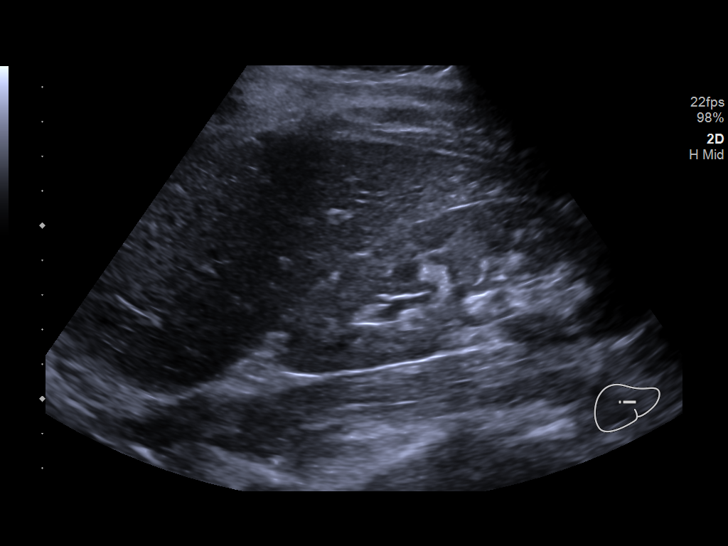
[im 34/34]
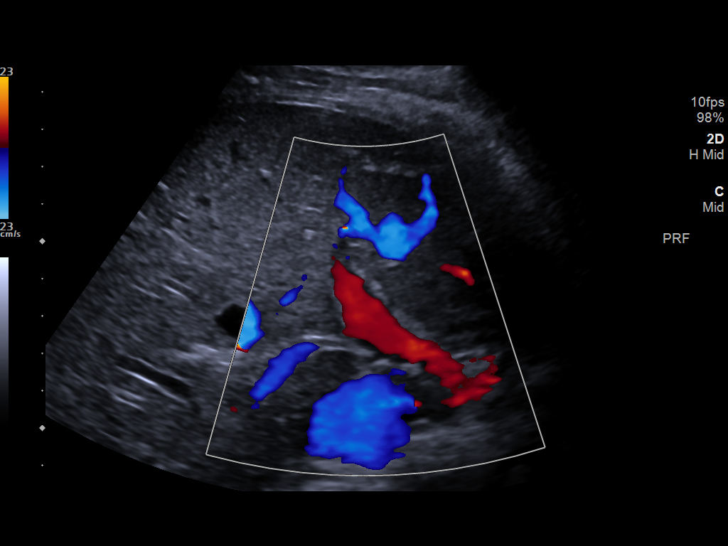

[14 of 25 positions shown; findings below may reference images not displayed]

FINDINGS: Gallbladder:

No gallstones or wall thickening visualized. No sonographic Murphy
sign noted by sonographer. Almost completely decompressed.

Common bile duct:

Diameter: 0.2 cm.

Liver:

No focal lesion identified. Within normal limits in parenchymal
echogenicity. Portal vein is patent on color Doppler imaging with
normal direction of blood flow towards the liver.

Other: None.
IMPRESSION: Negative for gallstones.  Negative exam.

## 2021-09-07 IMAGING — DX DG CHEST 2V
2 series · 2 of 2 positions shown · non-contrast
Comparison: Single-view of the chest 08/06/2017.

CLINICAL DATA: Onset nausea and vomiting this past weekend.

EXAM:
CHEST - 2 VIEW

[x chest ap]
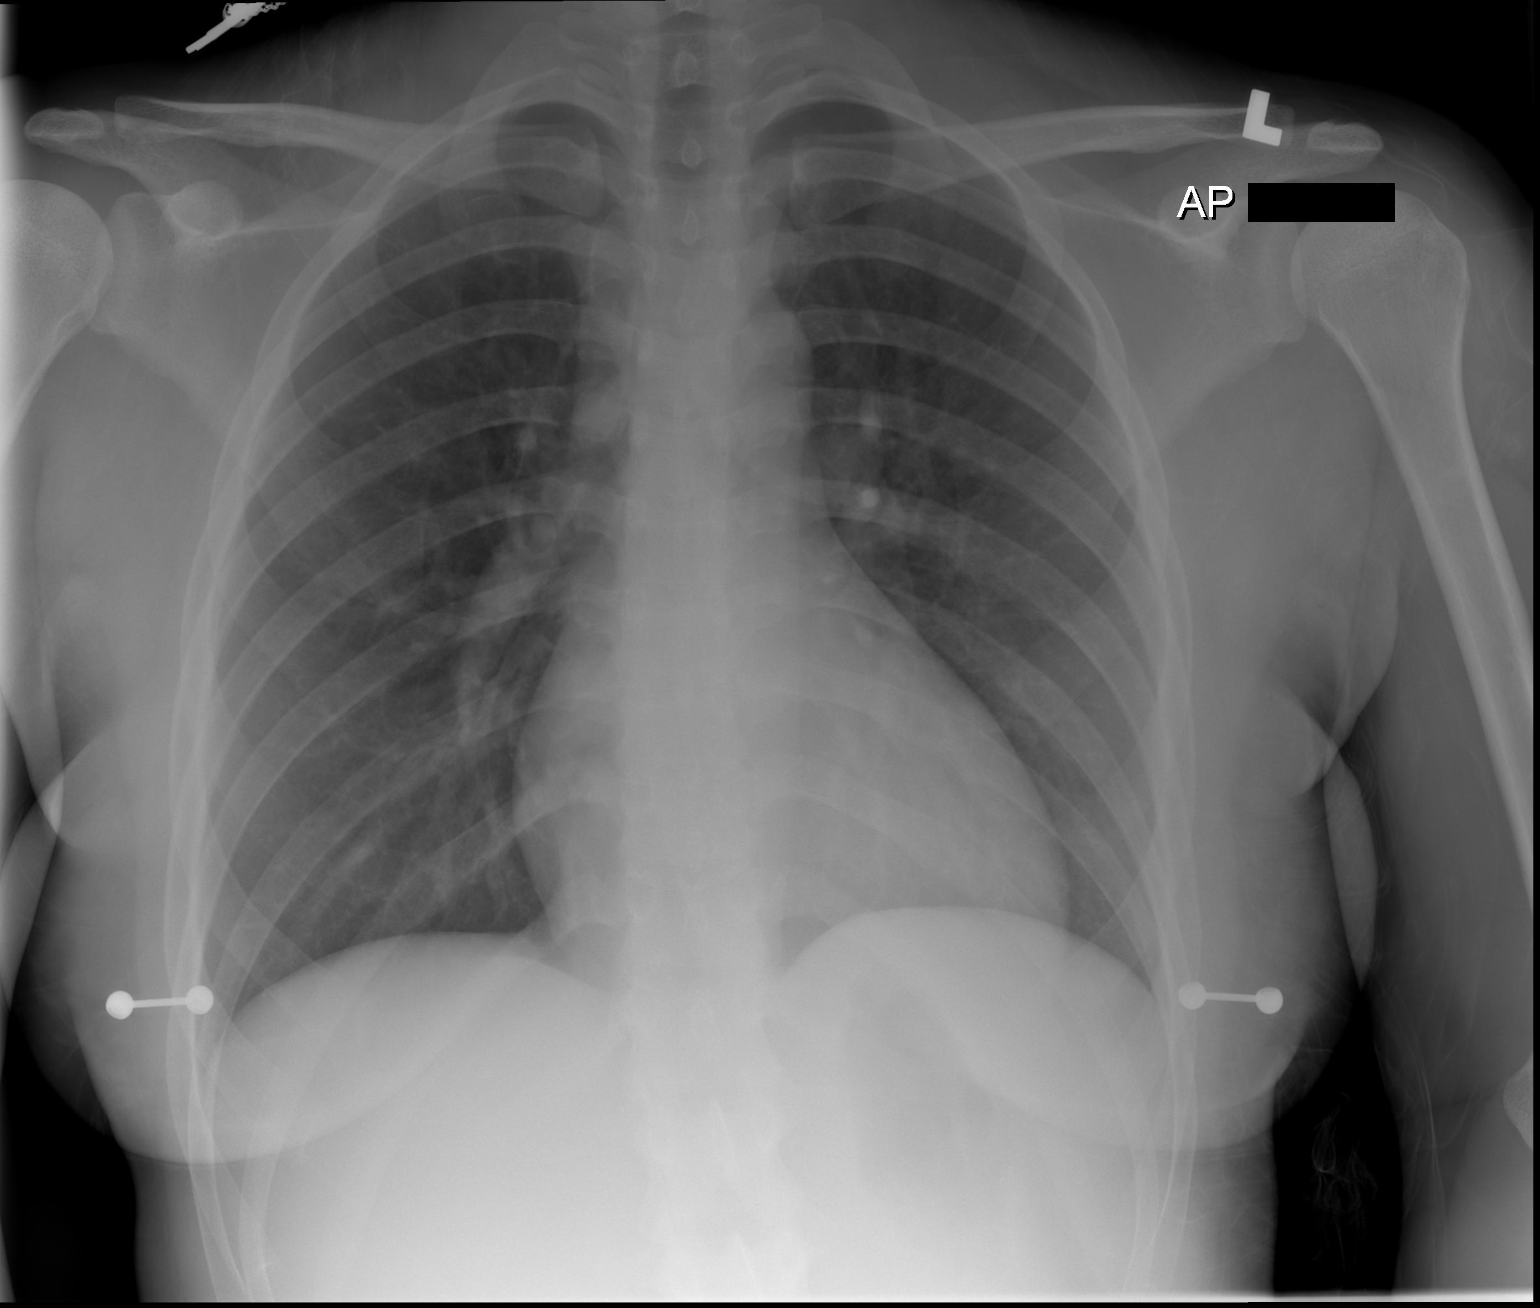

[w chest lat]
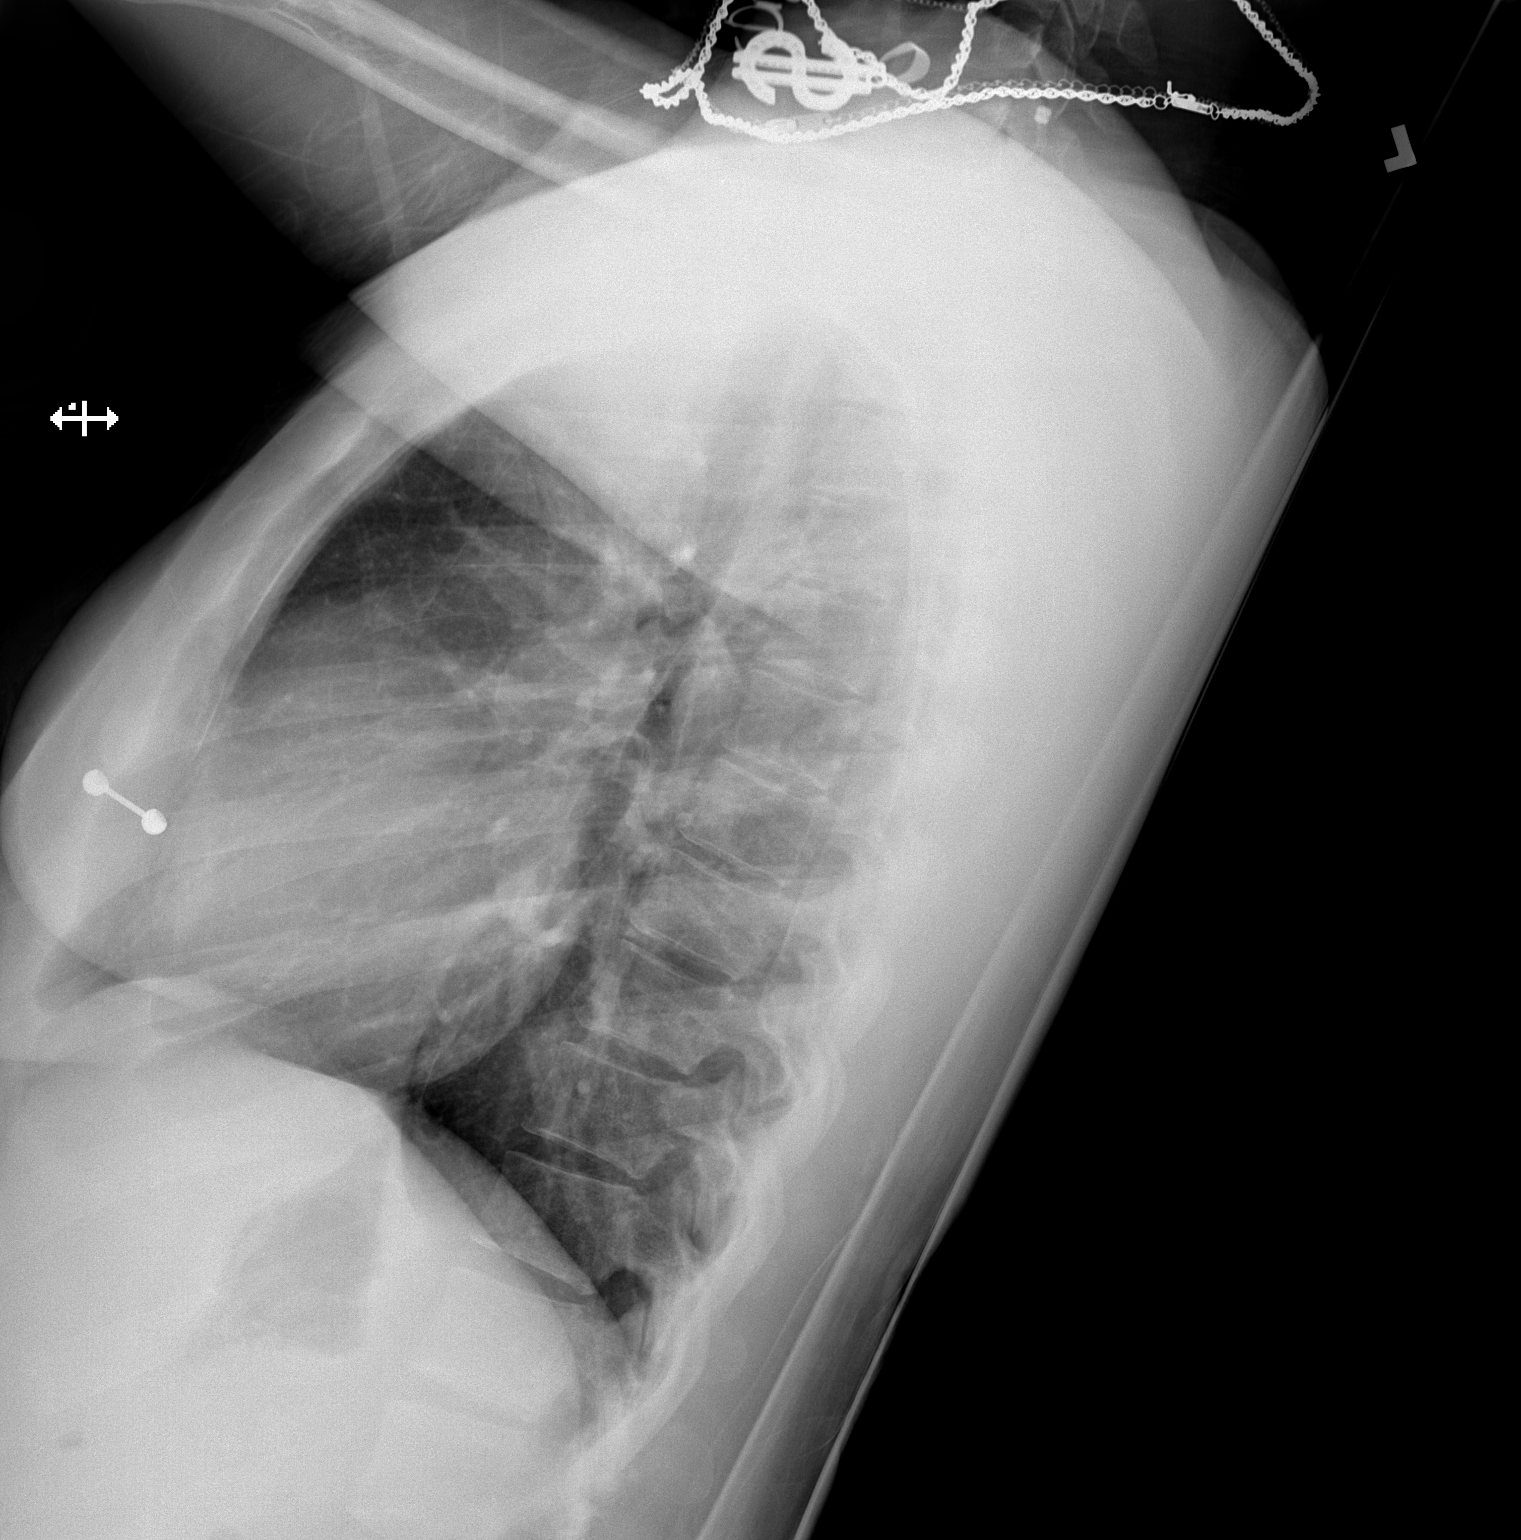

[2 of 2 positions shown; findings below may reference images not displayed]

FINDINGS: Lungs clear. Heart size normal. No pneumothorax or pleural fluid. No
bony abnormality.
IMPRESSION: Negative chest.

## 2023-05-17 ENCOUNTER — Telehealth: Payer: Self-pay | Admitting: Gastroenterology

## 2023-05-17 NOTE — Telephone Encounter (Signed)
Good Morning Dr Barron Alvine   Supervising MD AM   We received a call from patient wanting to schedule a consultation for nausea and vomiting. Patient was seen in 2022 with digestive health.   Requesting transfer of care for no specific reason. Records are available for review in epic. Please review and advise on scheduling.
